# Patient Record
Sex: Male | Born: 1985 | Race: White | Hispanic: No | Marital: Single | Smoking: Former smoker
Health system: Southern US, Community
[De-identification: ages and names within clinical notes are randomized; demographics above are authoritative.]

## PROBLEM LIST (undated history)

## (undated) ENCOUNTER — Ambulatory Visit: Admission: EM | Source: Home / Self Care

## (undated) DIAGNOSIS — F909 Attention-deficit hyperactivity disorder, unspecified type: Secondary | ICD-10-CM

## (undated) DIAGNOSIS — F1911 Other psychoactive substance abuse, in remission: Secondary | ICD-10-CM

## (undated) HISTORY — PX: TONSILLECTOMY: SUR1361

---

## 2009-03-06 ENCOUNTER — Inpatient Hospital Stay: Payer: Self-pay | Admitting: Psychiatry

## 2009-11-22 ENCOUNTER — Inpatient Hospital Stay: Payer: Self-pay | Admitting: Psychiatry

## 2014-04-21 ENCOUNTER — Emergency Department: Payer: Self-pay | Admitting: Emergency Medicine

## 2014-05-22 ENCOUNTER — Ambulatory Visit: Payer: Self-pay | Admitting: Physician Assistant

## 2014-05-30 ENCOUNTER — Ambulatory Visit: Payer: Self-pay | Admitting: Family Medicine

## 2014-10-05 ENCOUNTER — Emergency Department: Payer: Self-pay | Admitting: Student

## 2014-12-20 ENCOUNTER — Emergency Department: Payer: Self-pay | Admitting: Emergency Medicine

## 2016-10-23 ENCOUNTER — Emergency Department
Admission: EM | Admit: 2016-10-23 | Discharge: 2016-10-23 | Disposition: A | Discharge status: Home / Self Care | Payer: No Typology Code available for payment source | Attending: Emergency Medicine | Admitting: Emergency Medicine

## 2016-10-23 ENCOUNTER — Encounter: Payer: Self-pay | Admitting: Emergency Medicine

## 2016-10-23 ENCOUNTER — Emergency Department: Payer: No Typology Code available for payment source

## 2016-10-23 DIAGNOSIS — Y999 Unspecified external cause status: Secondary | ICD-10-CM | POA: Diagnosis not present

## 2016-10-23 DIAGNOSIS — M25552 Pain in left hip: Secondary | ICD-10-CM | POA: Diagnosis not present

## 2016-10-23 DIAGNOSIS — M25522 Pain in left elbow: Secondary | ICD-10-CM | POA: Diagnosis not present

## 2016-10-23 DIAGNOSIS — Y9389 Activity, other specified: Secondary | ICD-10-CM | POA: Insufficient documentation

## 2016-10-23 DIAGNOSIS — S79912A Unspecified injury of left hip, initial encounter: Secondary | ICD-10-CM | POA: Diagnosis present

## 2016-10-23 DIAGNOSIS — Y92410 Unspecified street and highway as the place of occurrence of the external cause: Secondary | ICD-10-CM | POA: Diagnosis not present

## 2016-10-23 MED ORDER — ONDANSETRON HCL 4 MG/2ML IJ SOLN
4.0000 mg | Freq: Once | INTRAMUSCULAR | Status: AC
  Administered 2016-10-23: 4 mg via INTRAVENOUS

## 2016-10-23 MED ORDER — ONDANSETRON HCL 4 MG/2ML IJ SOLN
INTRAMUSCULAR | Status: AC
  Administered 2016-10-23: 4 mg via INTRAVENOUS
  Filled 2016-10-23: qty 2

## 2016-10-23 MED ORDER — KETOROLAC TROMETHAMINE 30 MG/ML IJ SOLN
10.0000 mg | Freq: Once | INTRAMUSCULAR | Status: AC
  Administered 2016-10-23: 9.9 mg via INTRAVENOUS
  Filled 2016-10-23: qty 1

## 2016-10-23 MED ORDER — MORPHINE SULFATE (PF) 2 MG/ML IV SOLN
2.0000 mg | Freq: Once | INTRAVENOUS | Status: AC
  Administered 2016-10-23: 2 mg via INTRAVENOUS

## 2016-10-23 MED ORDER — OXYCODONE-ACETAMINOPHEN 5-325 MG PO TABS: 1.0000 | ORAL_TABLET | ORAL | 0 refills | Status: DC | PRN

## 2016-10-23 MED ORDER — MORPHINE SULFATE (PF) 2 MG/ML IV SOLN
INTRAVENOUS | Status: AC
  Administered 2016-10-23: 2 mg via INTRAVENOUS
  Filled 2016-10-23: qty 1

## 2016-10-23 MED ORDER — OXYCODONE-ACETAMINOPHEN 5-325 MG PO TABS
1.0000 | ORAL_TABLET | Freq: Once | ORAL | Status: AC
  Administered 2016-10-23: 1 via ORAL
  Filled 2016-10-23: qty 1

## 2016-10-23 MED ORDER — IBUPROFEN 800 MG PO TABS: 800.0000 mg | ORAL_TABLET | Freq: Three times a day (TID) | ORAL | 0 refills | Status: DC | PRN
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
## 2016-10-23 NOTE — ED Notes (Signed)
Pt's father called and given update per pt's request: Alec Lee [phone]

## 2016-10-23 NOTE — ED Notes (Signed)
Dr. Sung at bedside.  
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
## 2016-10-23 NOTE — Discharge Instructions (Signed)
1. Take pain medicines as needed (Motrin/Percocet). 2. Use crutches as needed to walk. 3. Apply ice to affected area several times daily.  4. Return to the ER for worsening symptoms, persistent vomiting, difficulty breathing or other concerns.
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
## 2017-06-29 ENCOUNTER — Encounter: Payer: Self-pay | Admitting: *Deleted

## 2017-06-29 ENCOUNTER — Ambulatory Visit
Admission: EM | Admit: 2017-06-29 | Discharge: 2017-06-29 | Disposition: A | Discharge status: Home / Self Care | Payer: Self-pay | Attending: Family Medicine | Admitting: Family Medicine

## 2017-06-29 DIAGNOSIS — R310 Gross hematuria: Secondary | ICD-10-CM

## 2017-06-29 LAB — URINALYSIS, COMPLETE (UACMP) WITH MICROSCOPIC
BACTERIA UA: NONE SEEN
BILIRUBIN URINE: NEGATIVE
Glucose, UA: NEGATIVE mg/dL
Hgb urine dipstick: NEGATIVE
KETONES UR: NEGATIVE mg/dL
Leukocytes, UA: NEGATIVE
Nitrite: NEGATIVE
PH: 6 (ref 5.0–8.0)
Protein, ur: NEGATIVE mg/dL
RBC / HPF: NONE SEEN RBC/hpf (ref 0–5)
SPECIFIC GRAVITY, URINE: 1.02 (ref 1.005–1.030)
WBC UA: NONE SEEN WBC/hpf (ref 0–5)

## 2017-06-29 LAB — CHLAMYDIA/NGC RT PCR (ARMC ONLY)
CHLAMYDIA TR: NOT DETECTED
N GONORRHOEAE: NOT DETECTED

## 2017-06-29 NOTE — ED Triage Notes (Signed)
Urethral pain and hematuria onset this morning.

## 2017-06-29 NOTE — Discharge Instructions (Signed)
Increase your water intake Over the counter tylenol/ibuprofen as needed

## 2017-06-29 NOTE — ED Provider Notes (Signed)
[facility]    CSN: [id number] Arrival date & time: 06/29/17  0940     History   Chief Complaint Chief Complaint  Patient presents with  . Hematuria    HPI Alec Lee is a 31 y.o. male.   31 yo male with a c/o pain to the tip of his penis and bleeding from penis since this morning. States that when he woke up and went to urinate, he felt a sudden, severe, transient, sharp pain with subsequent bleeding from the penis. States that the sever pain resolved quickly and now only feels slight discomfort when urinating and blood at the end of urination. Denies any penile discharge, fevers, chills, injuries/trauma. States he's not concerned about STDs; he is sexually active with one partner. He also states that he's had a similar episode with penile bleeding years ago and thinks he may have had and passed a kidney stone at that time.    The history is provided by the patient.    History reviewed. No pertinent past medical history.  There are no active problems to display for this patient.   History reviewed. No pertinent surgical history.     Home Medications    Prior to Admission medications   Medication Sig Start Date End Date Taking? Authorizing Provider  ibuprofen (ADVIL,MOTRIN) 800 MG tablet Take 1 tablet (800 mg total) by mouth every 8 (eight) hours as needed for moderate pain. 10/23/16   Irean Hong, MD  oxyCODONE-acetaminophen (ROXICET) 5-325 MG tablet Take 1 tablet by mouth every 4 (four) hours as needed for severe pain. 10/23/16   Irean Hong, MD    Family History History reviewed. No pertinent family history.  Social History Social History  Substance Use Topics  . Smoking status: Never Smoker  . Smokeless tobacco: Never Used  . Alcohol use Yes     Allergies   Patient has no known allergies.   Review of Systems Review of Systems   Physical Exam Triage Vital Signs ED Triage Vitals  Enc Vitals Group     BP 06/29/17 1039 117/67    Pulse Rate 06/29/17 1039 (!) 50     Resp 06/29/17 1039 16     Temp 06/29/17 1039 97.6 F (36.4 C)     Temp Source 06/29/17 1039 Oral     SpO2 06/29/17 1039 100 %     Weight 06/29/17 1041 206 lb (93.4 kg)     Height 06/29/17 1041  (1.803 m)     Head Circumference --      Peak Flow --      Pain Score --      Pain Loc --      Pain Edu? --      Excl. in GC? --    No data found.   Updated Vital Signs BP 117/67 (BP Location: Left Arm)   Pulse (!) 50   Temp 97.6 F (36.4 C) (Oral)   Resp 16   Ht  (1.803 m)   Wt 206 lb (93.4 kg)   SpO2 100%   BMI 28.73 kg/m   Visual Acuity Right Eye Distance:   Left Eye Distance:   Bilateral Distance:    Right Eye Near:   Left Eye Near:    Bilateral Near:     Physical Exam  Constitutional: He appears well-developed and well-nourished. No distress.  Genitourinary: Testes normal. No penile tenderness. No discharge found.  Genitourinary Comments: Drop of bright red blood at urethral  meatus  Skin: He is not diaphoretic.  Nursing note and vitals reviewed.    UC Treatments / Results  Labs (all labs ordered are listed, but only abnormal results are displayed) Labs Reviewed  URINALYSIS, COMPLETE (UACMP) WITH MICROSCOPIC - Abnormal; Notable for the following:       Result Value   Squamous Epithelial / LPF 0-5 (*)    All other components within normal limits  CHLAMYDIA/NGC RT PCR [facility] ONLY)    EKG  EKG Interpretation None       Radiology No results found.  Procedures Procedures (including critical care time)  Medications Ordered in UC Medications - No data to display   Initial Impression / Assessment and Plan / UC Course  I have reviewed the triage vital signs and the nursing notes.  Pertinent labs & imaging results that were available during my care of the patient were reviewed by me and considered in my medical decision making (see chart for details).      Final Clinical Impressions(s) / UC  Diagnoses   Final diagnoses:  Gross hematuria  (possibly secondary to nephrolithiasis)  New Prescriptions Discharge Medication List as of 06/29/2017 11:38 AM     1. Lab results and diagnosis reviewed with patient 2. Check urine gc/chlamydia 3. Recommend supportive treatment with increased water intake, otc analgesics prn  4. Follow-up prn if symptoms worsen or don't improve  Controlled Substance Prescriptions [place] Controlled Substance Registry consulted? Not Applicable   Payton Mccallum, MD 06/29/17 1240
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
## 2019-02-14 ENCOUNTER — Telehealth: Payer: Self-pay | Admitting: Internal Medicine

## 2019-02-14 ENCOUNTER — Telehealth: Payer: Self-pay | Admitting: *Deleted

## 2019-02-14 ENCOUNTER — Other Ambulatory Visit: Payer: Self-pay

## 2019-02-14 DIAGNOSIS — Z20822 Contact with and (suspected) exposure to covid-19: Secondary | ICD-10-CM

## 2019-02-14 NOTE — Telephone Encounter (Signed)
Runny nose fatigue chills diarrhea. Order placed. Called pt and left message to call back.

## 2019-02-14 NOTE — Telephone Encounter (Signed)
TC from [facility]. Nurse was inquiring to confirm the patient was tested. TC to patient. He was tested today.

## 2019-02-14 NOTE — Telephone Encounter (Signed)
Called pt and covid screening scheduled.

## 2019-02-17 LAB — NOVEL CORONAVIRUS, NAA: SARS-CoV-2, NAA: NOT DETECTED

## 2019-11-18 ENCOUNTER — Other Ambulatory Visit: Payer: Self-pay

## 2019-11-18 ENCOUNTER — Encounter: Payer: Self-pay | Admitting: Emergency Medicine

## 2019-11-18 ENCOUNTER — Ambulatory Visit (INDEPENDENT_AMBULATORY_CARE_PROVIDER_SITE_OTHER): Payer: BC Managed Care – PPO

## 2019-11-18 ENCOUNTER — Ambulatory Visit
Admission: EM | Admit: 2019-11-18 | Discharge: 2019-11-18 | Disposition: A | Discharge status: Home / Self Care | Payer: BC Managed Care – PPO | Attending: Family Medicine | Admitting: Family Medicine

## 2019-11-18 ENCOUNTER — Ambulatory Visit: Payer: BC Managed Care – PPO

## 2019-11-18 DIAGNOSIS — R1111 Vomiting without nausea: Secondary | ICD-10-CM

## 2019-11-18 DIAGNOSIS — M546 Pain in thoracic spine: Secondary | ICD-10-CM | POA: Diagnosis not present

## 2019-11-18 DIAGNOSIS — R3121 Asymptomatic microscopic hematuria: Secondary | ICD-10-CM

## 2019-11-18 DIAGNOSIS — R1110 Vomiting, unspecified: Secondary | ICD-10-CM | POA: Insufficient documentation

## 2019-11-18 DIAGNOSIS — Z79899 Other long term (current) drug therapy: Secondary | ICD-10-CM | POA: Diagnosis not present

## 2019-11-18 DIAGNOSIS — K5900 Constipation, unspecified: Secondary | ICD-10-CM

## 2019-11-18 DIAGNOSIS — F909 Attention-deficit hyperactivity disorder, unspecified type: Secondary | ICD-10-CM | POA: Insufficient documentation

## 2019-11-18 DIAGNOSIS — M549 Dorsalgia, unspecified: Secondary | ICD-10-CM

## 2019-11-18 DIAGNOSIS — R3129 Other microscopic hematuria: Secondary | ICD-10-CM | POA: Insufficient documentation

## 2019-11-18 DIAGNOSIS — Z20822 Contact with and (suspected) exposure to covid-19: Secondary | ICD-10-CM | POA: Insufficient documentation

## 2019-11-18 DIAGNOSIS — Z87891 Personal history of nicotine dependence: Secondary | ICD-10-CM | POA: Diagnosis not present

## 2019-11-18 HISTORY — DX: Attention-deficit hyperactivity disorder, unspecified type: F90.9

## 2019-11-18 LAB — URINALYSIS, COMPLETE (UACMP) WITH MICROSCOPIC
Bacteria, UA: NONE SEEN
Bilirubin Urine: NEGATIVE
Glucose, UA: NEGATIVE mg/dL
Ketones, ur: NEGATIVE mg/dL
Leukocytes,Ua: NEGATIVE
Nitrite: NEGATIVE
Protein, ur: NEGATIVE mg/dL
Specific Gravity, Urine: 1.025 (ref 1.005–1.030)
WBC, UA: NONE SEEN WBC/hpf (ref 0–5)
pH: 6 (ref 5.0–8.0)

## 2019-11-18 MED ORDER — IBUPROFEN 800 MG PO TABS: 800.0000 mg | ORAL_TABLET | Freq: Three times a day (TID) | ORAL | 0 refills | Status: DC

## 2019-11-18 MED ORDER — CYCLOBENZAPRINE HCL 5 MG PO TABS: 5.0000 mg | ORAL_TABLET | Freq: Every day | ORAL | 0 refills | Status: DC

## 2019-11-18 MED ORDER — POLYETHYLENE GLYCOL 3350 17 GM/SCOOP PO POWD: 17.0000 g | Freq: Every day | ORAL | 0 refills | Status: DC

## 2019-11-18 NOTE — ED Triage Notes (Signed)
Patient c/o mid back pain that started on Thursday.  Patient states that he vomited last night.  Patient denies fevers.

## 2019-11-18 NOTE — ED Provider Notes (Signed)
[facility]    CSN: [id number] Arrival date & time: 11/18/19  1401      History   Chief Complaint Chief Complaint  Patient presents with  . Back Pain    HPI Alec Lee is a 34 y.o. male.   Alec Lee presents with complaints of bilateral mid back pain which started two days ago, worse at night, hard time sleeping due to pain last night. Pain with getting up out of bed this morning. Took ibuprofen and has been active since and pain seems to have improved. No current back pain. No urinary symptoms, no blood in urine. Denies any previous similar. No abdominal pain. Feels like the pain did wrap around his flanks bilaterally last night. Last night vomited, but this did not seem to be related to his back pain. He states he has been constipated, hasn't passed a BM in approximately 1 week. Still eating and drinking. Last night was only emesis, undigested food. No blood. No fevers. Hasn't tried anything to help with constipation.    ROS per HPI, negative if not otherwise mentioned.      Past Medical History:  Diagnosis Date  . ADHD     There are no problems to display for this patient.   History reviewed. No pertinent surgical history.     Home Medications    Prior to Admission medications   Medication Sig Start Date End Date Taking? Authorizing Provider  amphetamine-dextroamphetamine (ADDERALL) 20 MG tablet Take 60 mg by mouth daily.  10/25/19  Yes [provider]  Buprenorphine HCl-Naloxone HCl 12-3 MG FILM  10/25/19  Yes [provider]  cyclobenzaprine (FLEXERIL) 5 MG tablet Take 1 tablet (5 mg total) by mouth at bedtime. 11/18/19   Georgetta Haber, NP  ibuprofen (ADVIL) 800 MG tablet Take 1 tablet (800 mg total) by mouth 3 (three) times daily. 11/18/19   Georgetta Haber, NP  oxyCODONE-acetaminophen (ROXICET) 5-325 MG tablet Take 1 tablet by mouth every 4 (four) hours as needed for severe pain. 10/23/16   Irean Hong, MD    polyethylene glycol powder (GLYCOLAX/MIRALAX) 17 GM/SCOOP powder Take 17 g by mouth daily. 11/18/19   Georgetta Haber, NP    Family History Family History  Problem Relation Age of Onset  . Healthy Mother   . Diabetes Father     Social History Social History   Tobacco Use  . Smoking status: Former Games developer  . Smokeless tobacco: Never Used  Substance Use Topics  . Alcohol use: Not Currently  . Drug use: No     Allergies   Patient has no known allergies.   Review of Systems Review of Systems   Physical Exam Triage Vital Signs ED Triage Vitals  Enc Vitals Group     BP 11/18/19 1500 118/77     Pulse Rate 11/18/19 1500 (!) 58     Resp 11/18/19 1500 16     Temp 11/18/19 1500 98 F (36.7 C)     Temp Source 11/18/19 1500 Oral     SpO2 11/18/19 1500 100 %     Weight 11/18/19 1458 210 lb (95.3 kg)     Height 11/18/19 1458 5\' 11"  (1.803 m)     Head Circumference --      Peak Flow --      Pain Score 11/18/19 1457 6     Pain Loc --      Pain Edu? --      Excl. in GC? --  No data found.  Updated Vital Signs BP 118/77 (BP Location: Left Arm)   Pulse (!) 58   Temp 98 F (36.7 C) (Oral)   Resp 16   Ht 5\' 11"  (1.803 m)   Wt 210 lb (95.3 kg)   SpO2 100%   BMI 29.29 kg/m   Visual Acuity Right Eye Distance:   Left Eye Distance:   Bilateral Distance:    Right Eye Near:   Left Eye Near:    Bilateral Near:     Physical Exam Constitutional:      Appearance: He is well-developed.  Cardiovascular:     Rate and Rhythm: Normal rate.  Pulmonary:     Effort: Pulmonary effort is normal.  Abdominal:     Tenderness: There is no abdominal tenderness. There is right CVA tenderness and left CVA tenderness. There is no guarding or rebound.     Comments: Very mild bilateral cva tenderness bilaterally   Musculoskeletal:     Lumbar back: Tenderness present. No bony tenderness.       Back:     Comments: Proximal lumbar/ distal thoracic mid back pain with point tenderness  on palpation without bony or spinous process tenderness; full ROM of back noted   Skin:    General: Skin is warm and dry.  Neurological:     Mental Status: He is alert and oriented to person, place, and time.      UC Treatments / Results  Labs (all labs ordered are listed, but only abnormal results are displayed) Labs Reviewed  URINALYSIS, COMPLETE (UACMP) WITH MICROSCOPIC - Abnormal; Notable for the following components:      Result Value   APPearance CLOUDY (*)    Hgb urine dipstick TRACE (*)    All other components within normal limits  NOVEL CORONAVIRUS, NAA (HOSP ORDER, SEND-OUT TO REF LAB; TAT 18-24 HRS)    EKG   Radiology DG Abd 1 View  Result Date: 11/18/2019 CLINICAL DATA:  Vomiting, constipation EXAM: ABDOMEN - 1 VIEW COMPARISON:  None. FINDINGS: Bowel gas pattern is unremarkable. There is moderate stool throughout the colon, greater on the right. No acute osseous abnormality. IMPRESSION: Unremarkable bowel gas pattern. Overall moderate stool burden, greater on the right. Electronically Signed   By: Macy Mis M.D.   On: 11/18/2019 15:37    Procedures Procedures (including critical care time)  Medications Ordered in UC Medications - No data to display  Initial Impression / Assessment and Plan / UC Course  I have reviewed the triage vital signs and the nursing notes.  Pertinent labs & imaging results that were available during my care of the patient were reviewed by me and considered in my medical decision making (see chart for details).     Mid back musculature with mild tenderness, but also with mild cva tenderness and trace hgb to urine. Nephrolithiasis vs muscular back pain discussed and considered. Ibuprofen has helped with pain. Encouraged increased fluid intake, ibuprofen use to pass any potential stones. KUB without obstruction, stool present. Constipation management discussed. Return precautions provided as may need renal ct if symptoms worsen or  persist. Patient verbalized understanding and agreeable to plan. Ambulatory out of clinic without difficulty.    Final Clinical Impressions(s) / UC Diagnoses   Final diagnoses:  Mid back pain  Microscopic hematuria  Constipation, unspecified constipation type     Discharge Instructions     It is difficult to say for certain if your back pain is muscular or if it could  also be related to small kidney stones. If it is stone's it is likely you will pass these. Drink plenty of water to empty your bladder regularly.  Ibuprofen every 8 hours to help with pain.  Flexeril at night as a muscle relaxer.  I do recommend miralax to help pass stool, take now and repeat tomorrow morning. May repeat tomorrow evening if still no BM. Once first bowel movement then take once a day until bowel habits are regular.  If worsening of pain, blood in urine, or persistent vomiting please go to the Er.    ED Prescriptions    Medication Sig Dispense Auth. Provider   polyethylene glycol powder (GLYCOLAX/MIRALAX) 17 GM/SCOOP powder Take 17 g by mouth daily. 255 g Linus Mako B, NP   ibuprofen (ADVIL) 800 MG tablet Take 1 tablet (800 mg total) by mouth 3 (three) times daily. 30 tablet Linus Mako B, NP   cyclobenzaprine (FLEXERIL) 5 MG tablet Take 1 tablet (5 mg total) by mouth at bedtime. 15 tablet Georgetta Haber, NP     PDMP not reviewed this encounter.   Georgetta Haber, NP 11/18/19 1609

## 2019-11-18 NOTE — Discharge Instructions (Signed)
It is difficult to say for certain if your back pain is muscular or if it could also be related to small kidney stones. If it is stone's it is likely you will pass these. Drink plenty of water to empty your bladder regularly.  Ibuprofen every 8 hours to help with pain.  Flexeril at night as a muscle relaxer.  I do recommend miralax to help pass stool, take now and repeat tomorrow morning. May repeat tomorrow evening if still no BM. Once first bowel movement then take once a day until bowel habits are regular.  If worsening of pain, blood in urine, or persistent vomiting please go to the Er.

## 2019-11-19 LAB — NOVEL CORONAVIRUS, NAA (HOSP ORDER, SEND-OUT TO REF LAB; TAT 18-24 HRS): SARS-CoV-2, NAA: NOT DETECTED

## 2020-01-25 ENCOUNTER — Other Ambulatory Visit: Payer: Self-pay

## 2020-01-25 ENCOUNTER — Ambulatory Visit
Admission: EM | Admit: 2020-01-25 | Discharge: 2020-01-25 | Disposition: A | Discharge status: Home / Self Care | Payer: BC Managed Care – PPO | Attending: Family Medicine | Admitting: Family Medicine

## 2020-01-25 ENCOUNTER — Encounter: Payer: Self-pay | Admitting: Emergency Medicine

## 2020-01-25 DIAGNOSIS — T1592XA Foreign body on external eye, part unspecified, left eye, initial encounter: Secondary | ICD-10-CM

## 2020-01-25 NOTE — Discharge Instructions (Signed)
Go straight to the address below: [facility]  [address] [place], [place]  [address]

## 2020-01-25 NOTE — ED Triage Notes (Signed)
Patient states he got something in his left eye about 1 week ago. He states he feels there is something in his eye and it may be metal.

## 2020-01-25 NOTE — ED Provider Notes (Signed)
[facility]    CSN: [id number] Arrival date & time: 01/25/20  1446      History   Chief Complaint Chief Complaint  Patient presents with  . Foreign Body in Eye    left eye    HPI  34 year old male presents with the above complaint.  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  He states that he does not recall an exact event where he got something in his eye.  He states that his left eye has been bothering him since Sunday.  He feels like there is something in his eye.  He reports eye redness.  Denies pain but has a foreign body sensation.  No relieving factors.  No other complaints at this time.  Of note, patient states that last week he was drilling and there was metal as well as foam that fell down.  Past Medical History:  Diagnosis Date  . ADHD     Home Medications    Prior to Admission medications   Medication Sig Start Date End Date Taking? Authorizing Provider  amphetamine-dextroamphetamine (ADDERALL) 20 MG tablet Take 60 mg by mouth daily.  10/25/19   [provider]  Buprenorphine HCl-Naloxone HCl 12-3 MG FILM  10/25/19   [provider]  cyclobenzaprine (FLEXERIL) 5 MG tablet Take 1 tablet (5 mg total) by mouth at bedtime. 11/18/19   Zigmund Gottron, NP  ibuprofen (ADVIL) 800 MG tablet Take 1 tablet (800 mg total) by mouth 3 (three) times daily. 11/18/19   Zigmund Gottron, NP  oxyCODONE-acetaminophen (ROXICET) 5-325 MG tablet Take 1 tablet by mouth every 4 (four) hours as needed for severe pain. 10/23/16   Paulette Blanch, MD  polyethylene glycol powder (GLYCOLAX/MIRALAX) 17 GM/SCOOP powder Take 17 g by mouth daily. 11/18/19   Zigmund Gottron, NP    Family History Family History  Problem Relation Age of Onset  . Healthy Mother   . Diabetes Father     Social History Social History   Tobacco Use  . Smoking status: Former Research scientist (life sciences)  . Smokeless tobacco: Never Used  Substance Use Topics  . Alcohol use: Not Currently  . Drug use: No      Allergies   Patient has no known allergies.   Review of Systems Review of Systems  Constitutional: Negative.   Eyes: Positive for redness.       Foreign body sensation left eye.    Physical Exam Triage Vital Signs ED Triage Vitals  Enc Vitals Group     BP 01/25/20 1502 (!) 141/83     Pulse Rate 01/25/20 1502 (!) 58     Resp 01/25/20 1502 18     Temp 01/25/20 1502 98.2 F (36.8 C)     Temp Source 01/25/20 1502 Oral     SpO2 01/25/20 1502 100 %     Weight 01/25/20 1500 210 lb (95.3 kg)     Height 01/25/20 1500 5\' 11"  (1.803 m)     Head Circumference --      Peak Flow --      Pain Score 01/25/20 1500 0     Pain Loc --      Pain Edu? --      Excl. in [place]? --    Updated Vital Signs BP (!) 141/83 (BP Location: Left Arm)   Pulse (!) 58   Temp 98.2 F (36.8 C) (Oral)   Resp 18   Ht 5\' 11"  (1.803 m)   Wt 95.3 kg  SpO2 100%   BMI 29.29 kg/m   Visual Acuity Right Eye Distance:   Left Eye Distance:   Bilateral Distance:    Right Eye Near:   Left Eye Near:    Bilateral Near:     Physical Exam Vitals and nursing note reviewed.  Constitutional:      General: He is not in acute distress.    Appearance: Normal appearance. He is not ill-appearing.  HENT:     Head: Normocephalic and atraumatic.  Eyes:      Comments: Metallic appearing foreign body noted at the level location.  Pulmonary:     Effort: Pulmonary effort is normal. No respiratory distress.  Neurological:     Mental Status: He is alert.  Psychiatric:        Mood and Affect: Mood normal.        Behavior: Behavior normal.    UC Treatments / Results  Labs (all labs ordered are listed, but only abnormal results are displayed) Labs Reviewed - No data to display  EKG   Radiology No results found.  Procedures Procedures (including critical care time)  Medications Ordered in UC Medications - No data to display  Initial Impression / Assessment and Plan / UC Course  I have reviewed the  triage vital signs and the nursing notes.  Pertinent labs & imaging results that were available during my care of the patient were reviewed by me and considered in my medical decision making (see chart for details).    34 year old male presents with a foreign body in his left eye.  Unable to remove today.  I believe that this is a metallic foreign body.  I have contacted [facility] and they have agreed to see the patient.  Patient going directly to [facility] for removal of foreign body.  Final Clinical Impressions(s) / UC Diagnoses   Final diagnoses:  Foreign body of left eye, initial encounter     Discharge Instructions     Go straight to the address below: [facility]  [address] [place], [place]  [address]   ED Prescriptions    None     PDMP not reviewed this encounter.   Tommie Sams, [place] 01/25/20 1541

## 2020-04-05 ENCOUNTER — Other Ambulatory Visit: Payer: Self-pay

## 2020-04-05 ENCOUNTER — Ambulatory Visit
Admission: EM | Admit: 2020-04-05 | Discharge: 2020-04-05 | Disposition: A | Discharge status: Home / Self Care | Payer: BC Managed Care – PPO | Attending: Internal Medicine | Admitting: Internal Medicine

## 2020-04-05 DIAGNOSIS — N23 Unspecified renal colic: Secondary | ICD-10-CM | POA: Diagnosis present

## 2020-04-05 HISTORY — DX: Other psychoactive substance abuse, in remission: F19.11

## 2020-04-05 LAB — URINALYSIS, COMPLETE (UACMP) WITH MICROSCOPIC
Bilirubin Urine: NEGATIVE
Glucose, UA: NEGATIVE mg/dL
Ketones, ur: NEGATIVE mg/dL
Leukocytes,Ua: NEGATIVE
Nitrite: NEGATIVE
Protein, ur: NEGATIVE mg/dL
Specific Gravity, Urine: >1.03 — ABNORMAL HIGH (ref 1.005–1.030)
pH: 5 (ref 5.0–8.0)

## 2020-04-05 NOTE — ED Triage Notes (Signed)
Pt states he has intermittent abdominal pain since last week and mid back pain. Unsure if he's passed a kidney stone. Pain to abdomen is on the "sides" but no pain currently.

## 2020-04-05 NOTE — ED Provider Notes (Signed)
[facility]    CSN: [id number] Arrival date & time: 04/05/20  1500      History   Chief Complaint Chief Complaint  Patient presents with  . Abdominal Pain  . Back Pain    HPI Alec Lee is a 34 y.o. male comes to the urgent care complaining of brief episode of dysuria this morning.  Patient has a history of recurrent renal stones and thinks that he may have passed a stone this morning.  He has some flank pain yesterday with increased pain.  Patient denies any nausea or vomiting at this time.  The pain has subsided at this time.  No fever or chills.   HPI  Past Medical History:  Diagnosis Date  . ADHD   . Substance abuse in remission (HCC)     There are no problems to display for this patient.   Past Surgical History:  Procedure Laterality Date  . TONSILLECTOMY         Home Medications    Prior to Admission medications   Medication Sig Start Date End Date Taking? Authorizing Provider  amphetamine-dextroamphetamine (ADDERALL) 20 MG tablet Take 20 mg by mouth 3 (three) times daily. 03/13/20   [provider]  Buprenorphine HCl-Naloxone HCl 12-3 MG FILM Place under the tongue. 03/13/20   [provider]    Family History Family History  Problem Relation Age of Onset  . Healthy Mother   . Diabetes Father     Social History Social History   Tobacco Use  . Smoking status: Former Games developer  . Smokeless tobacco: Never Used  Vaping Use  . Vaping Use: Former  Substance Use Topics  . Alcohol use: Not Currently  . Drug use: No     Allergies   Patient has no known allergies.   Review of Systems Review of Systems  Constitutional: Negative.   HENT: Negative.   Respiratory: Negative.   Gastrointestinal: Negative.  Negative for abdominal pain, nausea and vomiting.     Physical Exam Triage Vital Signs ED Triage Vitals  Enc Vitals Group     BP 04/05/20 1524 118/80     Pulse Rate 04/05/20 1524 72     Resp 04/05/20 1524  18     Temp 04/05/20 1524 98.4 F (36.9 C)     Temp Source 04/05/20 1524 Oral     SpO2 04/05/20 1524 98 %     Weight 04/05/20 1522 215 lb (97.5 kg)     Height 04/05/20 1522 5\' 11"  (1.803 m)     Head Circumference --      Peak Flow --      Pain Score 04/05/20 1522 0     Pain Loc --      Pain Edu? --      Excl. in [place]? --    No data found.  Updated Vital Signs BP 118/80 (BP Location: Right Arm)   Pulse 72   Temp 98.4 F (36.9 C) (Oral)   Resp 18   Ht 5\' 11"  (1.803 m)   Wt 97.5 kg   SpO2 98%   BMI 29.99 kg/m   Visual Acuity Right Eye Distance:   Left Eye Distance:   Bilateral Distance:    Right Eye Near:   Left Eye Near:    Bilateral Near:     Physical Exam Cardiovascular:     Rate and Rhythm: Normal rate and regular rhythm.  Pulmonary:     Effort: Pulmonary effort is normal.  Breath sounds: Normal breath sounds.  Abdominal:     General: Bowel sounds are normal.     Palpations: Abdomen is soft.     Tenderness: There is no abdominal tenderness. There is no right CVA tenderness or left CVA tenderness.     Hernia: No hernia is present.      UC Treatments / Results  Labs (all labs ordered are listed, but only abnormal results are displayed) Labs Reviewed  URINALYSIS, COMPLETE (UACMP) WITH MICROSCOPIC - Abnormal; Notable for the following components:      Result Value   Specific Gravity, Urine >1.030 (*)    Hgb urine dipstick TRACE (*)    Bacteria, UA FEW (*)    All other components within normal limits    EKG   Radiology No results found.  Procedures Procedures (including critical care time)  Medications Ordered in UC Medications - No data to display  Initial Impression / Assessment and Plan / UC Course  I have reviewed the triage vital signs and the nursing notes.  Pertinent labs & imaging results that were available during my care of the patient were reviewed by me and considered in my medical decision making (see chart for details).      1.  Dysuria: No pain at this time Urinalysis is significant for trace hemoglobin with no red blood cells in urine. Urine specific gravity is greater than 1.030 Patient is advised to increase oral fluid intake Final Clinical Impressions(s) / UC Diagnoses   Final diagnoses:  Renal colic, bilateral   Discharge Instructions   None    ED Prescriptions    None     PDMP not reviewed this encounter.   Merrilee Jansky, MD 04/05/20 [phone]

## 2020-05-08 ENCOUNTER — Ambulatory Visit
Admission: EM | Admit: 2020-05-08 | Discharge: 2020-05-08 | Disposition: A | Discharge status: Home / Self Care | Payer: BC Managed Care – PPO | Attending: Emergency Medicine | Admitting: Emergency Medicine

## 2020-05-08 ENCOUNTER — Other Ambulatory Visit: Payer: Self-pay

## 2024-05-13 ENCOUNTER — Emergency Department

## 2024-05-13 ENCOUNTER — Other Ambulatory Visit: Payer: Self-pay

## 2024-05-13 ENCOUNTER — Emergency Department
Admission: EM | Admit: 2024-05-13 | Discharge: 2024-05-14 | Disposition: A | Discharge status: Home / Self Care | Attending: Emergency Medicine | Admitting: Emergency Medicine

## 2024-05-13 DIAGNOSIS — Z9189 Other specified personal risk factors, not elsewhere classified: Secondary | ICD-10-CM | POA: Insufficient documentation

## 2024-05-13 DIAGNOSIS — R0602 Shortness of breath: Secondary | ICD-10-CM | POA: Insufficient documentation

## 2024-05-13 DIAGNOSIS — R079 Chest pain, unspecified: Secondary | ICD-10-CM | POA: Diagnosis not present

## 2024-05-13 DIAGNOSIS — R202 Paresthesia of skin: Secondary | ICD-10-CM | POA: Diagnosis not present

## 2024-05-13 DIAGNOSIS — R42 Dizziness and giddiness: Secondary | ICD-10-CM | POA: Insufficient documentation

## 2024-05-13 LAB — BASIC METABOLIC PANEL WITH GFR
Anion gap: 11 (ref 5–15)
BUN: 14 mg/dL (ref 6–20)
CO2: 24 mmol/L (ref 22–32)
Calcium: 9.4 mg/dL (ref 8.9–10.3)
Chloride: 103 mmol/L (ref 98–111)
Creatinine, Ser: 0.96 mg/dL (ref 0.61–1.24)
GFR, Estimated: >60 mL/min (ref >60)
Glucose, Bld: 95 mg/dL (ref 70–99)
Potassium: 3.8 mmol/L (ref 3.5–5.1)
Sodium: 138 mmol/L (ref 135–145)

## 2024-05-13 LAB — CBC
HCT: 46.6 % (ref 39.0–52.0)
Hemoglobin: 15.6 g/dL (ref 13.0–17.0)
MCH: 29.5 pg (ref 26.0–34.0)
MCHC: 33.5 g/dL (ref 30.0–36.0)
MCV: 88.1 fL (ref 80.0–100.0)
Platelets: 310 K/uL (ref 150–400)
RBC: 5.29 MIL/uL (ref 4.22–5.81)
RDW: 12.4 % (ref 11.5–15.5)
WBC: 8.1 K/uL (ref 4.0–10.5)
nRBC: 0 % (ref 0.0–0.2)

## 2024-05-13 LAB — TROPONIN I (HIGH SENSITIVITY): Troponin I (High Sensitivity): 3 ng/L (ref <18)

## 2024-05-13 NOTE — ED Triage Notes (Signed)
 Pt arrives with c/o CP that started this morning. Pt reports SOB. Pt denies n/v.

## 2024-05-14 MED ORDER — HYDROXYZINE HCL 50 MG PO TABS
50.0000 mg | ORAL_TABLET | Freq: Once | ORAL | Status: AC
  Administered 2024-05-14: 50 mg via ORAL
  Filled 2024-05-14: qty 1

## 2024-05-14 MED ORDER — HYDROXYZINE HCL 10 MG PO TABS: 10.0000 mg | ORAL_TABLET | Freq: Three times a day (TID) | ORAL | 0 refills | Status: AC | PRN

## 2024-05-14 NOTE — ED Provider Notes (Signed)
 [facility] Provider Note   Event Date/Time   First MD Initiated Contact with Patient 05/13/24 2350     (approximate) History  Chest Pain  HPI Alec Lee is a 38 y.o. male currently on buprenorphine who presents for right-sided chest pain, lightheadedness, and shortness of breath with associated hand tingling that began today while in a meeting with his boss.  Patient states that he has been under increased stress as well as not sleeping well (averaging 4-5 hours per night).  Patient states that after getting up and walking around his symptoms improved and he only complains of mild shortness of breath at this time.  Patient denies any early cardiac history in family members.  Patient denies any family history of abnormal heart rhythms.  Patient denies any exertional worsening of this pain. ROS: Patient currently denies any vision changes, tinnitus, difficulty speaking, facial droop, sore throat, chest pain, abdominal pain, nausea/vomiting/diarrhea, dysuria, or weakness/numbness/paresthesias in any extremity   Physical Exam  Triage Vital Signs: ED Triage Vitals  Encounter Vitals Group     BP 05/13/24 1853 134/69     Girls Systolic BP Percentile --      Girls Diastolic BP Percentile --      Boys Systolic BP Percentile --      Boys Diastolic BP Percentile --      Pulse Rate 05/13/24 1853 67     Resp 05/13/24 1853 (!) 21     Temp 05/13/24 1853 99 F (37.2 C)     Temp Source 05/13/24 1853 Oral     SpO2 05/13/24 1853 99 %     Weight 05/13/24 1840 250 lb (113.4 kg)     Height 05/13/24 1840 5' 11 (1.803 m)     Head Circumference --      Peak Flow --      Pain Score 05/13/24 1840 6     Pain Loc --      Pain Education --      Exclude from Growth Chart --    Most recent vital signs: Vitals:   05/13/24 1853  BP: 134/69  Pulse: 67  Resp: (!) 21  Temp: 99 F (37.2 C)  SpO2: 99%   General: Awake, oriented x4. CV:  Good peripheral perfusion.  No  MGR Resp:  Normal effort.  CTAB Abd:  No distention. Other:  Middle-aged obese Caucasian male resting comfortably in no acute distress ED Results / Procedures / Treatments  Labs (all labs ordered are listed, but only abnormal results are displayed) Labs Reviewed  BASIC METABOLIC PANEL WITH GFR  CBC  TROPONIN I (HIGH SENSITIVITY)  TROPONIN I (HIGH SENSITIVITY)   EKG ED ECG REPORT I, Artist MARLA Kerns, the attending physician, personally viewed and interpreted this ECG. Date: 05/14/2024 EKG Time: 1843 Rate: 62 Rhythm: normal sinus rhythm QRS Axis: normal Intervals: normal ST/T Wave abnormalities: normal Narrative Interpretation: no evidence of acute ischemia RADIOLOGY ED MD interpretation: 2 view chest x-ray interpreted by me shows no evidence of acute abnormalities including no pneumonia, pneumothorax, or widened mediastinum - All radiology independently interpreted and agree with radiology assessment Official radiology report(s): DG Chest 2 View Result Date: 05/13/2024 CLINICAL DATA:  Chest pain. EXAM: CHEST - 2 VIEW COMPARISON:  None Available. FINDINGS: The heart size and mediastinal contours are within normal limits. Both lungs are clear. The visualized skeletal structures are unremarkable. IMPRESSION: No active cardiopulmonary disease. Electronically Signed   By: Suzen Dials M.D.   On: 05/13/2024  20:08   PROCEDURES: Critical Care performed: No Procedures MEDICATIONS ORDERED IN ED: Medications  hydrOXYzine  (ATARAX ) tablet 50 mg (has no administration in time range)   IMPRESSION / MDM / ASSESSMENT AND PLAN / ED COURSE  I reviewed the triage vital signs and the nursing notes.                             The patient is on the cardiac monitor to evaluate for evidence of arrhythmia and/or significant heart rate changes. Patient's presentation is most consistent with acute presentation with potential threat to life or bodily function. This patient presents with symptoms  consistent with acute anxiety reaction / panic attack. Low suspicion for acute cardiopulmonary process including ACS, PE, or thoracic aortic dissection. Denies any ingestions or any other medical complaints. No evidence of alcohol withdrawal symptoms. Presentation not consistent with overt toxidrome, ingestion given history & physical. Presentation not consistent with organic or medical emergency at this time. No acute indication for psychiatric consultation (without SI/HI, AH/VH). Cautious return precautions discussed with full understanding. Troponin negative x 1.  Symptoms started over 3 hours ago.  Chest x-ray negative.  CBC and BMP show no evidence of acute abnormalities Plan: Rx hydroxyzine , Psych follow up PRN  Dispo: Discharge home with PCP and psych follow-up   FINAL CLINICAL IMPRESSION(S) / ED DIAGNOSES   Final diagnoses:  Right-sided chest pain  Shortness of breath  Hand paresthesia  At increased risk for stress   Rx / DC Orders   ED Discharge Orders          Ordered    hydrOXYzine  (ATARAX ) 10 MG tablet  3 times daily PRN        05/14/24 0030           Note:  This document was prepared using Dragon voice recognition software and may include unintentional dictation errors.   Roslin Norwood K, MD 05/14/24 [phone]

## 2024-07-23 ENCOUNTER — Emergency Department

## 2024-07-23 ENCOUNTER — Other Ambulatory Visit: Payer: Self-pay

## 2024-07-23 ENCOUNTER — Emergency Department
Admission: EM | Admit: 2024-07-23 | Discharge: 2024-07-23 | Disposition: A | Discharge status: Home / Self Care | Attending: Emergency Medicine | Admitting: Emergency Medicine

## 2024-07-23 DIAGNOSIS — R079 Chest pain, unspecified: Secondary | ICD-10-CM

## 2024-07-23 DIAGNOSIS — K2970 Gastritis, unspecified, without bleeding: Secondary | ICD-10-CM | POA: Insufficient documentation

## 2024-07-23 LAB — CBC
HCT: 41.5 % (ref 39.0–52.0)
Hemoglobin: 13.9 g/dL (ref 13.0–17.0)
MCH: 29.6 pg (ref 26.0–34.0)
MCHC: 33.5 g/dL (ref 30.0–36.0)
MCV: 88.5 fL (ref 80.0–100.0)
Platelets: 284 K/uL (ref 150–400)
RBC: 4.69 MIL/uL (ref 4.22–5.81)
RDW: 12.6 % (ref 11.5–15.5)
WBC: 6.1 K/uL (ref 4.0–10.5)
nRBC: 0 % (ref 0.0–0.2)

## 2024-07-23 LAB — BASIC METABOLIC PANEL WITH GFR
Anion gap: 8 (ref 5–15)
BUN: 21 mg/dL — ABNORMAL HIGH (ref 6–20)
CO2: 27 mmol/L (ref 22–32)
Calcium: 8.9 mg/dL (ref 8.9–10.3)
Chloride: 102 mmol/L (ref 98–111)
Creatinine, Ser: 0.99 mg/dL (ref 0.61–1.24)
GFR, Estimated: >60 mL/min (ref >60)
Glucose, Bld: 94 mg/dL (ref 70–99)
Potassium: 3.9 mmol/L (ref 3.5–5.1)
Sodium: 137 mmol/L (ref 135–145)

## 2024-07-23 LAB — TROPONIN I (HIGH SENSITIVITY): Troponin I (High Sensitivity): 2 ng/L (ref <18)

## 2024-07-23 MED ORDER — PANTOPRAZOLE SODIUM 40 MG PO TBEC: 40.0000 mg | DELAYED_RELEASE_TABLET | Freq: Every day | ORAL | 1 refills | Status: AC

## 2024-07-23 MED ORDER — ALUM & MAG HYDROXIDE-SIMETH 200-200-20 MG/5ML PO SUSP
30.0000 mL | Freq: Once | ORAL | Status: AC
  Administered 2024-07-23: 30 mL via ORAL
  Filled 2024-07-23: qty 30

## 2024-07-23 MED ORDER — SUCRALFATE 1 G PO TABS: 1.0000 g | ORAL_TABLET | Freq: Four times a day (QID) | ORAL | 0 refills | Status: AC

## 2024-07-23 MED ORDER — LIDOCAINE VISCOUS HCL 2 % MT SOLN
15.0000 mL | Freq: Once | OROMUCOSAL | Status: AC
  Administered 2024-07-23: 15 mL via ORAL
  Filled 2024-07-23: qty 15

## 2024-07-23 NOTE — ED Notes (Signed)
 Pt stating no longer feels any pain, only numbness from the medications given.

## 2024-07-23 NOTE — Discharge Instructions (Addendum)
 Please take your Protonix each morning for the next 2 months (instead of Prilosec).  Please take sucralfate before breakfast, lunch, dinner and bed for the next 2 weeks.  Return to the emergency department for any return of/worsening pain or any other symptom concerning to yourself.

## 2024-07-23 NOTE — ED Provider Notes (Signed)
 [facility] Provider Note    Event Date/Time   First MD Initiated Contact with Patient 07/23/24 [phone]     (approximate)  History   Chief Complaint: Chest Pain  HPI  Dalon Reichart is a 38 y.o. male with a past medical history of ADHD, substance abuse in remission on Suboxone, who presents to the emergency department for chest pain.  According to the patient for the past 2 months he has been intermittently experiencing chest pain and at times shortness of breath.  Patient states he has been diagnosed with anxiety/panic attacks.  He was prescribed hydroxyzine  but admits he has not really been using this medication because it makes him too loopy and tired.  Patient states he recently was using Afrin multiple times per day for the last 2 years and stopped this 2 weeks ago.  He also states 2 weeks ago he stopped using all nicotine products.  He states he feels over the last 2 weeks his symptoms have worsened as far as his anxiety.  He states he has been experiencing gastric reflux tried Prilosec without relief.  He thinks the chest pain could be originating from reflux.  Physical Exam   Triage Vital Signs: ED Triage Vitals  Encounter Vitals Group     BP 07/23/24 0903 134/76     Girls Systolic BP Percentile --      Girls Diastolic BP Percentile --      Boys Systolic BP Percentile --      Boys Diastolic BP Percentile --      Pulse Rate 07/23/24 0903 65     Resp 07/23/24 0903 20     Temp 07/23/24 0903 97.7 F (36.5 C)     Temp Source 07/23/24 0903 Oral     SpO2 07/23/24 0903 100 %     Weight 07/23/24 0901 255 lb (115.7 kg)     Height 07/23/24 0901 5' 10 (1.778 m)     Head Circumference --      Peak Flow --      Pain Score 07/23/24 0859 8     Pain Loc --      Pain Education --      Exclude from Growth Chart --     Most recent vital signs: Vitals:   07/23/24 0903  BP: 134/76  Pulse: 65  Resp: 20  Temp: 97.7 F (36.5 C)  SpO2: 100%     General: Awake, no distress.  CV:  Good peripheral perfusion.  Regular rate and rhythm  Resp:  Normal effort.  Equal breath sounds bilaterally.  Abd:  No distention.  Soft, nontender.  No rebound or guarding.  ED Results / Procedures / Treatments   EKG  EKG viewed and interpreted by myself shows sinus bradycardia at 59 bpm with a narrow QRS, normal axis, normal intervals, nonspecific but no concerning ST changes.  RADIOLOGY  I have reviewed interpret the chest x-ray images.  No consolidation seen on my evaluation. Radiology has read the x-ray as negative.  MEDICATIONS ORDERED IN ED: Medications - No data to display   IMPRESSION / MDM / ASSESSMENT AND PLAN / ED COURSE  I reviewed the triage vital signs and the nursing notes.  Patient's presentation is most consistent with acute presentation with potential threat to life or bodily function.  Patient presents to the emergency department with multiple symptoms which he believes are anxiety/panic attacks.  Patient states for the last 2 months but especially over the last 2  weeks he has been experiencing pain in the chest at times, feeling short of breath at times.  Patient does admit to recently stopping Afrin after daily use for 2 years, recently stopping all nicotine products after daily use for many years.  These very well could be causing a degree of withdrawal and worsening his underlying anxiety disorder.  However given the patient's chest pain complaints we will check labs, including a cardiac enzyme, chest x-ray and EKG.  Given the patient's heartburn symptoms we will also trial a GI cocktail in the emergency department to see if this relieves the discomfort.  We will continue to closely monitor.  Vital signs reassuring.  Physical exam reassuring.  Patient's lab work is reassuring normal CBC normal chemistry negative troponin reassuring chest x-ray normal EKG.  Patient states after the GI cocktail his chest discomfort went  completely away but states it is now starting to come back just a little.  Will discharge on Protonix and Suprefact have the patient follow-up with his doctor.  Patient is agreeable to this plan.  FINAL CLINICAL IMPRESSION(S) / ED DIAGNOSES   Chest pain Anxiety   Note:  This document was prepared using Dragon voice recognition software and may include unintentional dictation errors.   Dorothyann Drivers, MD 07/23/24 1224

## 2024-07-23 NOTE — ED Triage Notes (Signed)
 Pt to ED for acid reflux, burning mid chest pain and SOB ongoing since 2 months and not getting any better. States he may be anxious. States he checked his pulse last night (while in bed) and it was 47. Pt takes suboxone. States he was using Afrin nasal spray daily for 2 years until 2 weeks ago. Respirations are unlabored, skin dry.

## 2024-07-28 ENCOUNTER — Encounter: Payer: Self-pay | Admitting: *Deleted

## 2024-07-28 ENCOUNTER — Ambulatory Visit
Admission: EM | Admit: 2024-07-28 | Discharge: 2024-07-28 | Disposition: A | Discharge status: Home / Self Care | Attending: Physician Assistant | Admitting: Physician Assistant

## 2024-07-28 ENCOUNTER — Ambulatory Visit (INDEPENDENT_AMBULATORY_CARE_PROVIDER_SITE_OTHER)

## 2024-07-28 DIAGNOSIS — R079 Chest pain, unspecified: Secondary | ICD-10-CM

## 2024-07-28 DIAGNOSIS — R42 Dizziness and giddiness: Secondary | ICD-10-CM | POA: Diagnosis not present

## 2024-07-28 DIAGNOSIS — R519 Headache, unspecified: Secondary | ICD-10-CM | POA: Diagnosis not present

## 2024-07-28 DIAGNOSIS — F419 Anxiety disorder, unspecified: Secondary | ICD-10-CM

## 2024-07-28 MED ORDER — KETOROLAC TROMETHAMINE 60 MG/2ML IM SOLN
60.0000 mg | Freq: Once | INTRAMUSCULAR | Status: AC
  Administered 2024-07-28: 60 mg via INTRAMUSCULAR

## 2024-07-28 MED ORDER — PROMETHAZINE HCL 25 MG PO TABS: 25.0000 mg | ORAL_TABLET | Freq: Four times a day (QID) | ORAL | 0 refills | Status: AC | PRN

## 2024-07-28 NOTE — ED Provider Notes (Signed)
 [facility]    CSN: [id number] Arrival date & time: 07/28/24  1150      History   Chief Complaint Chief Complaint  Patient presents with   Chest Pain   Dizziness    HPI Garrett Mitchum is a 38 y.o. male presenting for headache, dizziness, nausea, feeling foggy and central chest pain since yesterday. Patient says he has not felt well for the past 2 months since having a panic attack. Seen in the ER for that and had full workup. Did not take hydroxyzine  prescribed for anxiety. He says he has tried to really change his lifestyle over the past 3 weeks. He has stopped smoking, drinking caffeine, and taking Afrin daily. He is taking Suboxone but would like to eventually stop. Recently started going to the gym. He did the bike and cardio yesterday and thinks he could have pulled a muscle in the chest.  Patient was seen 5 days ago for chest pain in the ER. In the emergency room, he underwent diagnostic tests including an EKG, chest x-ray, and blood work, all of which were normal. He was prescribed pantoprazole for GERD which he has been taking and says does help his chest burning.   Patient denies shortness of breath at this time. He says he feels the need to take a deep breath at times.   Per PCP note 1 month ago, He feels anxious, nervous, and on edge multiple times a week, with difficulty controlling his worrying. He has trouble relaxing, restlessness, irritability, and a persistent fear that something awful might happen, occurring nearly every day.  PMH significant for ADHD, severe anxiety, panic disorder, low testosterone, substance abuse in remission, and obesity.  HPI  Past Medical History:  Diagnosis Date   ADHD    Substance abuse in remission (HCC)     There are no active problems to display for this patient.   Past Surgical History:  Procedure Laterality Date   TONSILLECTOMY         Home Medications    Prior to Admission medications   Medication  Sig Start Date End Date Taking? Authorizing Provider  Buprenorphine HCl-Naloxone HCl 12-3 MG FILM Place under the tongue. 03/13/20  Yes [provider]  pantoprazole (PROTONIX) 40 MG tablet Take 1 tablet (40 mg total) by mouth daily. 07/23/24 07/23/25 Yes Dorothyann Drivers, MD  promethazine (PHENERGAN) 25 MG tablet Take 1 tablet (25 mg total) by mouth every 6 (six) hours as needed for nausea (dizziness or nausea). 07/28/24  Yes Arvis Huxley B, PA-C  sucralfate (CARAFATE) 1 g tablet Take 1 tablet (1 g total) by mouth 4 (four) times daily for 15 days. 07/23/24 08/07/24 Yes Dorothyann Drivers, MD  amphetamine-dextroamphetamine (ADDERALL) 20 MG tablet Take 20 mg by mouth 3 (three) times daily. 03/13/20   [provider]  hydrOXYzine  (ATARAX ) 10 MG tablet Take 1 tablet (10 mg total) by mouth 3 (three) times daily as needed for anxiety. 05/14/24   Jossie Artist POUR, MD    Family History Family History  Problem Relation Age of Onset   Healthy Mother    Diabetes Father     Social History Social History   Tobacco Use   Smoking status: Former   Smokeless tobacco: Never  [employer]   Vaping status: Former  Substance Use Topics   Alcohol use: Not Currently   Drug use: Not Currently    Comment: hx opioid use, on suboxone     Allergies   Patient has no  known allergies.   Review of Systems Review of Systems  Constitutional:  Positive for fatigue. Negative for fever.  HENT:  Negative for congestion.   Eyes:  Negative for visual disturbance.  Respiratory:  Negative for cough and shortness of breath.   Cardiovascular:  Positive for chest pain. Negative for palpitations.  Gastrointestinal:  Negative for abdominal pain, diarrhea, nausea and vomiting.  Musculoskeletal:  Negative for back pain.  Neurological:  Positive for dizziness and headaches. Negative for syncope, facial asymmetry, weakness and numbness.  Psychiatric/Behavioral:  Negative for confusion, dysphoric mood and  suicidal ideas. The patient is nervous/anxious.      Physical Exam Triage Vital Signs ED Triage Vitals  Encounter Vitals Group     BP      Girls Systolic BP Percentile      Girls Diastolic BP Percentile      Boys Systolic BP Percentile      Boys Diastolic BP Percentile      Pulse      Resp      Temp      Temp src      SpO2      Weight      Height      Head Circumference      Peak Flow      Pain Score      Pain Loc      Pain Education      Exclude from Growth Chart    No data found.  Updated Vital Signs BP 123/82 (BP Location: Left Arm)   Pulse (!) 57   Temp 98.4 F (36.9 C) (Oral)   Resp 16   SpO2 95%    Physical Exam Vitals and nursing note reviewed.  Constitutional:      General: He is not in acute distress.    Appearance: Normal appearance. He is well-developed. He is not ill-appearing.  HENT:     Head: Normocephalic and atraumatic.     Nose: Nose normal.     Mouth/Throat:     Mouth: Mucous membranes are moist.     Pharynx: Oropharynx is clear.  Eyes:     General: No scleral icterus.    Extraocular Movements: Extraocular movements intact.     Conjunctiva/sclera: Conjunctivae normal.     Pupils: Pupils are equal, round, and reactive to light.  Cardiovascular:     Rate and Rhythm: Regular rhythm. Bradycardia present.  Pulmonary:     Effort: Pulmonary effort is normal. No respiratory distress.     Breath sounds: Normal breath sounds.  Abdominal:     Palpations: Abdomen is soft.     Tenderness: There is no abdominal tenderness.  Musculoskeletal:     Cervical back: Neck supple.  Skin:    General: Skin is warm and dry.     Capillary Refill: Capillary refill takes less than 2 seconds.  Neurological:     General: No focal deficit present.     Mental Status: He is alert and oriented to person, place, and time. Mental status is at baseline.     Cranial Nerves: No cranial nerve deficit.     Motor: No weakness.     Coordination: Coordination normal.      Gait: Gait normal.  Psychiatric:        Mood and Affect: Mood normal.        Behavior: Behavior normal.      UC Treatments / Results  Labs (all labs ordered are listed, but only abnormal results are displayed)  Labs Reviewed - No data to display  EKG   Radiology DG Chest 2 View Result Date: 07/28/2024 EXAM: 2 VIEW(S) XRAY OF THE CHEST 07/28/2024 12:41:02 PM COMPARISON: 07/23/2024 CLINICAL HISTORY: chest pain and sob x 1 month FINDINGS: LUNGS AND PLEURA: No focal pulmonary opacity. No pulmonary edema. No pleural effusion. No pneumothorax. HEART AND MEDIASTINUM: No acute abnormality of the cardiac and mediastinal silhouettes. BONES AND SOFT TISSUES: No acute osseous abnormality. IMPRESSION: 1. No acute process. Electronically signed by: Evalene Coho MD 07/28/2024 12:57 PM EDT RP Workstation: [id number]    Procedures ED EKG  Date/Time: 07/28/2024 12:42 PM  Performed by: Arvis Jolan NOVAK, PA-C Authorized by: Arvis Jolan NOVAK, PA-C   Previous ECG:    Previous ECG:  Compared to current   Similarity:  No change Interpretation:    Interpretation: normal   Rate:    ECG rate:  55   ECG rate assessment: bradycardic   Rhythm:    Rhythm: sinus rhythm   Ectopy:    Ectopy: none   QRS:    QRS axis:  Normal   QRS intervals:  Normal   QRS conduction: normal   ST segments:    ST segments:  Normal T waves:    T waves: normal   Comments:     Sinus bradycardia.  (including critical care time)  Medications Ordered in UC Medications  ketorolac  (TORADOL ) injection 60 mg (60 mg Intramuscular Given 07/28/24 1300)    Initial Impression / Assessment and Plan / UC Course  I have reviewed the triage vital signs and the nursing notes.  Pertinent labs & imaging results that were available during my care of the patient were reviewed by me and considered in my medical decision making (see chart for details).   38 year old male with history of ADHD, substance abuse intermission, severe  anxiety, panic disorder, testosterone, and GERD presents for headache, dizziness, feeling cloudy headed and having central chest pain since yesterday.  Patient has been seen numerous times for complaints of chest pain.  Initially seen in August when he had a panic attack.  He followed up with primary care provider at the end of September and then went to the ER 5 days ago.  He has had EKGs, chest x-rays, lab work without any significant abnormalities.  He reports over the past 3 weeks he has stopped drinking caffeine, stopped using Afrin, and stop smoking.  He has also tried to eat healthier and recently returned to working out.  Reviewed multiple ED notes, PCP notes, EKGs, Chest x-rays and labs.  Vitals are all stable.  He is bradycardic at 57 bpm.  Eye exam is normal.  Chest clear.  Heart regular rate and rhythm.  EKG shows sinus bradycardia, otherwise normal.  Chest x-ray is normal.  Reviewed results with patient.  At this time his symptoms are consistent with migraine headache.  He has dizziness, cloudy headedness, headache and nausea.  Strong family history of migraines.  Treated patient at this time with ketorolac  60 mg IM injection.  Sent promethazine for home use as needed for nausea and dizziness.  Encouraged increasing rest and fluids.  Advised him to continue pantoprazole for GERD.  This is likely the cause of his chest pain especially since he recently had a negative workup.  Encouraged him to make a follow-up appointment with his primary care provider.  Reviewed going to ED for any acute worsening of symptoms.  Patient is agreeable.   Final Clinical Impressions(s) / UC Diagnoses  Final diagnoses:  Chest pain, unspecified type  Acute nonintractable headache, unspecified headache type  Dizziness     Discharge Instructions      - Normal EKG and chest x-ray. - Treating your headache today.  A lot of your symptoms are likely stress and anxiety related.  We gave you an injection of  Toradol  in the clinic and I sent promethazine to the pharmacy which will help with dizziness and nausea.  It may make you tired.  Make sure to rest. - Continue with the acid reflux medication as some of your chest pain could be related to that. - Make an appointment with your primary care provider to follow-up on this. - Go to the ER if acute worsening of any of your symptoms.     ED Prescriptions     Medication Sig Dispense Auth. Provider   promethazine (PHENERGAN) 25 MG tablet Take 1 tablet (25 mg total) by mouth every 6 (six) hours as needed for nausea (dizziness or nausea). 12 tablet Cartha Rotert B, PA-C      PDMP not reviewed this encounter.   Arvis Jolan NOVAK, PA-C 07/28/24 1312

## 2024-07-28 NOTE — ED Triage Notes (Addendum)
 Pt states that about a month ago he had a panic attack and hasn't felt the same since. Last week he went to ED for chest pain was given Protonix and Carafate which he has been taking. He also has been taking Tums.   Today dizzy, chest pains, last night woke up in a panic and pressure in the temples of his head. Pt states he recently stopped dipping and using Afrin multiple times a day

## 2024-07-28 NOTE — Discharge Instructions (Addendum)
-   Normal EKG and chest x-ray. - Treating your headache today.  A lot of your symptoms are likely stress and anxiety related.  We gave you an injection of Toradol  in the clinic and I sent promethazine to the pharmacy which will help with dizziness and nausea.  It may make you tired.  Make sure to rest. - Continue with the acid reflux medication as some of your chest pain could be related to that. - Make an appointment with your primary care provider to follow-up on this. - Go to the ER if acute worsening of any of your symptoms.

## 2024-09-12 ENCOUNTER — Other Ambulatory Visit: Payer: Self-pay

## 2024-09-12 ENCOUNTER — Emergency Department
Admission: EM | Admit: 2024-09-12 | Discharge: 2024-09-12 | Disposition: A | Discharge status: Home / Self Care | Attending: Emergency Medicine | Admitting: Emergency Medicine

## 2024-09-12 ENCOUNTER — Emergency Department

## 2024-09-12 DIAGNOSIS — R42 Dizziness and giddiness: Secondary | ICD-10-CM | POA: Insufficient documentation

## 2024-09-12 LAB — CBC
HCT: 43.2 % (ref 39.0–52.0)
Hemoglobin: 14.3 g/dL (ref 13.0–17.0)
MCH: 28.8 pg (ref 26.0–34.0)
MCHC: 33.1 g/dL (ref 30.0–36.0)
MCV: 86.9 fL (ref 80.0–100.0)
Platelets: 256 K/uL (ref 150–400)
RBC: 4.97 MIL/uL (ref 4.22–5.81)
RDW: 12.7 % (ref 11.5–15.5)
WBC: 8.7 K/uL (ref 4.0–10.5)
nRBC: 0 % (ref 0.0–0.2)

## 2024-09-12 LAB — BASIC METABOLIC PANEL WITH GFR
Anion gap: 10 (ref 5–15)
BUN: 9 mg/dL (ref 6–20)
CO2: 27 mmol/L (ref 22–32)
Calcium: 9.5 mg/dL (ref 8.9–10.3)
Chloride: 104 mmol/L (ref 98–111)
Creatinine, Ser: 0.84 mg/dL (ref 0.61–1.24)
GFR, Estimated: >60 mL/min (ref >60)
Glucose, Bld: 97 mg/dL (ref 70–99)
Potassium: 4.8 mmol/L (ref 3.5–5.1)
Sodium: 140 mmol/L (ref 135–145)

## 2024-09-12 NOTE — ED Notes (Signed)
Lab at bedside to collect blood.  

## 2024-09-12 NOTE — ED Provider Notes (Signed)
 [facility] Provider Note    Event Date/Time   First MD Initiated Contact with Patient 09/12/24 0745     (approximate)   History   Chief Complaint: Hypertension   HPI  Alec Lee is a 38 y.o. male with a history of anxiety, Suboxone maintenance who comes ED complaining of elevated blood pressure this morning, 145/90 at home on automated blood pressure cuff.  Reports that usually his blood pressures 120s systolic.  He has been eating salty foods recently, Vietnamese pho and chicken noodle soup.  Denies chest pain or shortness of breath.  Has some chronic GERD symptoms which are minor and unchanged.  Reports some vertigo symptoms as well, worse with changing position and turning his head.  No blurry vision or double vision, no paresthesias, motor weakness, or neck pain.  No trauma.  No fever.        Past Medical History:  Diagnosis Date   ADHD    Substance abuse in remission [facility])     Current Outpatient Rx   Order #: [id number] Class: Historical Med   Order #: [id number] Class: Historical Med   Order #: [id number] Class: Normal   Order #: [id number] Class: Normal   Order #: [id number] Class: Normal   Order #: [id number] Class: Normal    Past Surgical History:  Procedure Laterality Date   TONSILLECTOMY      Physical Exam   Triage Vital Signs: ED Triage Vitals  Encounter Vitals Group     BP 09/12/24 0739 (!) 145/83     Girls Systolic BP Percentile --      Girls Diastolic BP Percentile --      Boys Systolic BP Percentile --      Boys Diastolic BP Percentile --      Pulse Rate 09/12/24 0739 75     Resp 09/12/24 0739 18     Temp 09/12/24 0739 98 F (36.7 C)     Temp src --      SpO2 09/12/24 0739 96 %     Weight 09/12/24 0738 255 lb (115.7 kg)     Height 09/12/24 0738 5' 11 (1.803 m)     Head Circumference --      Peak Flow --      Pain Score 09/12/24 0736 0     Pain Loc --      Pain Education --      Exclude from Growth Chart --      Most recent vital signs: Vitals:   09/12/24 1000 09/12/24 1045  BP:  128/74  Pulse:  62  Resp:  17  Temp: 98.2 F (36.8 C)   SpO2:  99%    General: Awake, no distress.  CV:  Good peripheral perfusion.  Regular rate rhythm Resp:  Normal effort.  Clear lungs Abd:  No distention.  Soft nontender Other:  Neuro intact.   ED Results / Procedures / Treatments   Labs (all labs ordered are listed, but only abnormal results are displayed) Labs Reviewed  BASIC METABOLIC PANEL WITH GFR  CBC     EKG Interpreted by me Normal sinus rhythm rate of 79.  Right axis, normal intervals.  No acute ischemic changes.  No evidence of right heart strain.   RADIOLOGY Chest x-ray interpreted by me, unremarkable.  Radiology report reviewed   PROCEDURES:  Procedures   MEDICATIONS ORDERED IN ED: Medications - No data to display   IMPRESSION / MDM / ASSESSMENT AND PLAN / ED COURSE  I reviewed the triage  vital signs and the nursing notes.  DDx: Anxiety, electrolyte derangement, AKI, transient blood pressure effect from dietary sodium intake  Patient's presentation is most consistent with acute presentation with potential threat to life or bodily function.  Patient presents with dizziness, vague symptoms most attributable to his anxiety history.  Also reports feeling like he has had a lot of health problems since he discontinued use of nicotine pouches.  Doubt stroke, dissection, ACS, PE, infection.  Will check labs.   Clinical Course as of 09/12/24 1110  Mon Sep 12, 2024  1108 Labs normal.  Patient also Now recalls that an hour before symptoms started last night, he took Sudafed because he was starting to feel like he was getting some sinus congestion and his child has been sick recently.  Symptoms are very much compatible with expected side effects of Sudafed including dizziness, head pressure, mildly elevated blood pressure.  He will avoid taking these.  There may also be a  contribution of nicotine abstinence since he recently stopped using nicotine products just 2 months ago.  Stable for discharge. [PS]    Clinical Course User Index [PS] Viviann Pastor, MD     FINAL CLINICAL IMPRESSION(S) / ED DIAGNOSES   Final diagnoses:  Dizziness     Rx / DC Orders   ED Discharge Orders     None        Note:  This document was prepared using Dragon voice recognition software and may include unintentional dictation errors.   Viviann Pastor, MD 09/12/24 [phone]

## 2024-09-12 NOTE — ED Notes (Signed)
 Called lab to get blood work.

## 2024-09-12 NOTE — ED Notes (Signed)
 Pt reports having a constant headache for the last 2 weeks. Pt reports he thinks it is due to having high BP for the last month. Pt reports that his BP this morning was 145/90. Pt also reports having chest tightness but believes this might be due to his hx of GERD. Pt reports chest tightness 2/10 at this time. Pt's BP is 128/74 (91) at this time. Pt A&Ox4. Pt NAD at this time.

## 2024-09-12 NOTE — ED Triage Notes (Addendum)
 Pt comes with c/o HTN for few months. Pt states his pcp is aware but hasn't placed pt on meds. Pt states last few days it has been elevated. 145/90 this morning.   Pt also states tightness in chest but does have hx of GERD but not sure what the feeling might be. Pt states some dizziness and feeling like his head is going to explode.

## 2024-10-03 ENCOUNTER — Other Ambulatory Visit: Payer: Self-pay | Admitting: Family

## 2024-10-03 DIAGNOSIS — R519 Headache, unspecified: Secondary | ICD-10-CM

## 2024-10-04 ENCOUNTER — Other Ambulatory Visit: Payer: Self-pay | Admitting: Family

## 2024-10-04 DIAGNOSIS — R519 Headache, unspecified: Secondary | ICD-10-CM

## 2024-10-04 DIAGNOSIS — R1084 Generalized abdominal pain: Secondary | ICD-10-CM

## 2024-10-05 ENCOUNTER — Ambulatory Visit: Admission: RE | Admit: 2024-10-05 | Source: Ambulatory Visit
